# Patient Record
Sex: Female | Born: 1965 | Race: Black or African American | Hispanic: No | Marital: Single | Smoking: Never smoker
Health system: Southern US, Community
[De-identification: ages and names within clinical notes are randomized; demographics above are authoritative.]

## PROBLEM LIST (undated history)

## (undated) DIAGNOSIS — B9689 Other specified bacterial agents as the cause of diseases classified elsewhere: Secondary | ICD-10-CM

## (undated) DIAGNOSIS — N760 Acute vaginitis: Secondary | ICD-10-CM

## (undated) DIAGNOSIS — J45909 Unspecified asthma, uncomplicated: Secondary | ICD-10-CM

## (undated) HISTORY — PX: BREAST REDUCTION SURGERY: SHX8

## (undated) HISTORY — PX: ABDOMINAL HYSTERECTOMY: SHX81

## (undated) HISTORY — PX: TUBAL LIGATION: SHX77

---

## 2005-02-11 ENCOUNTER — Other Ambulatory Visit: Admission: RE | Admit: 2005-02-11 | Discharge: 2005-02-11 | Payer: Self-pay | Admitting: Family Medicine

## 2005-05-19 ENCOUNTER — Encounter: Admission: RE | Admit: 2005-05-19 | Discharge: 2005-05-19 | Payer: Self-pay | Admitting: Plastic Surgery

## 2005-06-04 ENCOUNTER — Encounter: Admission: RE | Admit: 2005-06-04 | Discharge: 2005-06-04 | Payer: Self-pay | Admitting: Plastic Surgery

## 2006-03-23 ENCOUNTER — Ambulatory Visit: Payer: Self-pay | Admitting: Internal Medicine

## 2006-06-01 ENCOUNTER — Ambulatory Visit: Payer: Self-pay | Admitting: Internal Medicine

## 2006-10-07 ENCOUNTER — Ambulatory Visit: Payer: Self-pay | Admitting: Internal Medicine

## 2006-12-20 ENCOUNTER — Encounter: Payer: Self-pay | Admitting: Internal Medicine

## 2006-12-20 DIAGNOSIS — R609 Edema, unspecified: Secondary | ICD-10-CM

## 2006-12-20 DIAGNOSIS — J309 Allergic rhinitis, unspecified: Secondary | ICD-10-CM | POA: Insufficient documentation

## 2006-12-20 DIAGNOSIS — E669 Obesity, unspecified: Secondary | ICD-10-CM | POA: Insufficient documentation

## 2006-12-20 DIAGNOSIS — J45909 Unspecified asthma, uncomplicated: Secondary | ICD-10-CM | POA: Insufficient documentation

## 2006-12-20 DIAGNOSIS — D509 Iron deficiency anemia, unspecified: Secondary | ICD-10-CM

## 2007-01-15 ENCOUNTER — Emergency Department (HOSPITAL_COMMUNITY): Admission: EM | Admit: 2007-01-15 | Discharge: 2007-01-15 | Payer: Self-pay | Admitting: *Deleted

## 2007-02-08 ENCOUNTER — Ambulatory Visit: Payer: Self-pay | Admitting: Internal Medicine

## 2007-02-08 DIAGNOSIS — E785 Hyperlipidemia, unspecified: Secondary | ICD-10-CM

## 2007-02-08 DIAGNOSIS — J019 Acute sinusitis, unspecified: Secondary | ICD-10-CM

## 2007-02-08 DIAGNOSIS — R21 Rash and other nonspecific skin eruption: Secondary | ICD-10-CM | POA: Insufficient documentation

## 2007-02-08 DIAGNOSIS — L708 Other acne: Secondary | ICD-10-CM

## 2007-02-08 DIAGNOSIS — R42 Dizziness and giddiness: Secondary | ICD-10-CM | POA: Insufficient documentation

## 2007-02-11 ENCOUNTER — Encounter: Payer: Self-pay | Admitting: Internal Medicine

## 2007-07-16 ENCOUNTER — Encounter: Payer: Self-pay | Admitting: Internal Medicine

## 2007-07-21 ENCOUNTER — Encounter: Payer: Self-pay | Admitting: Internal Medicine

## 2007-09-15 ENCOUNTER — Ambulatory Visit: Payer: Self-pay | Admitting: Internal Medicine

## 2007-09-15 DIAGNOSIS — F411 Generalized anxiety disorder: Secondary | ICD-10-CM | POA: Insufficient documentation

## 2007-09-15 DIAGNOSIS — F329 Major depressive disorder, single episode, unspecified: Secondary | ICD-10-CM

## 2007-09-23 ENCOUNTER — Ambulatory Visit (HOSPITAL_BASED_OUTPATIENT_CLINIC_OR_DEPARTMENT_OTHER): Admission: RE | Admit: 2007-09-23 | Discharge: 2007-09-23 | Payer: Self-pay | Admitting: General Surgery

## 2007-09-23 ENCOUNTER — Encounter (INDEPENDENT_AMBULATORY_CARE_PROVIDER_SITE_OTHER): Payer: Self-pay | Admitting: General Surgery

## 2008-03-22 ENCOUNTER — Ambulatory Visit: Payer: Self-pay | Admitting: Internal Medicine

## 2008-03-22 ENCOUNTER — Telehealth (INDEPENDENT_AMBULATORY_CARE_PROVIDER_SITE_OTHER): Payer: Self-pay | Admitting: *Deleted

## 2008-03-22 DIAGNOSIS — R030 Elevated blood-pressure reading, without diagnosis of hypertension: Secondary | ICD-10-CM | POA: Insufficient documentation

## 2008-03-23 ENCOUNTER — Telehealth (INDEPENDENT_AMBULATORY_CARE_PROVIDER_SITE_OTHER): Payer: Self-pay | Admitting: *Deleted

## 2008-05-25 ENCOUNTER — Telehealth (INDEPENDENT_AMBULATORY_CARE_PROVIDER_SITE_OTHER): Payer: Self-pay | Admitting: *Deleted

## 2008-07-08 ENCOUNTER — Telehealth: Payer: Self-pay | Admitting: Internal Medicine

## 2008-09-10 ENCOUNTER — Emergency Department: Payer: Self-pay | Admitting: Emergency Medicine

## 2009-06-09 IMAGING — CR DG CHEST 1V PORT
1 series · 1 of 1 positions shown · non-contrast
Comparison: none

CLINICAL DATA: Chest pressure.  Palpitations.
 PORTABLE CHEST - 1 VIEW - 01/15/07 AT 4974 HOURS:

[view not recorded]
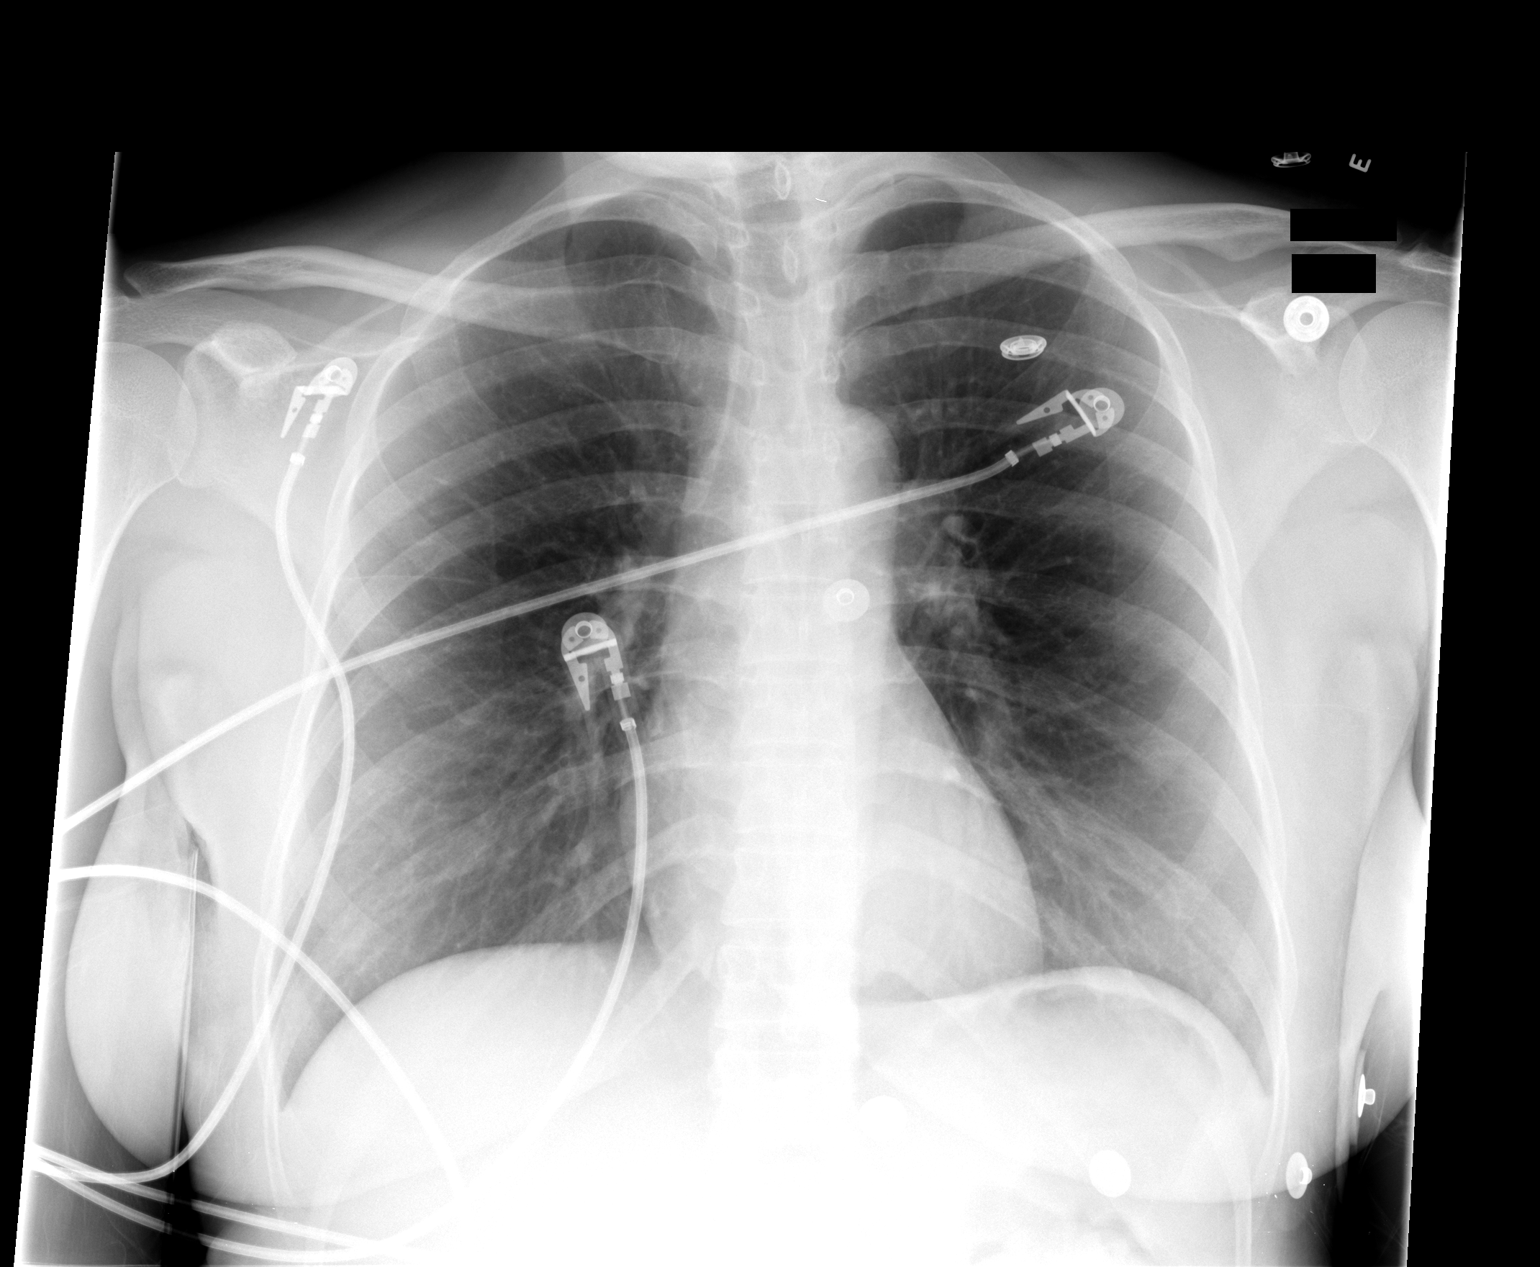

[1 of 1 positions shown; findings below may reference images not displayed]

FINDINGS: The heart size and mediastinal contours are within normal limits.  Both lungs are clear.
IMPRESSION: No acute disease.

## 2010-04-14 ENCOUNTER — Encounter: Payer: Self-pay | Admitting: Obstetrics and Gynecology

## 2010-04-14 ENCOUNTER — Encounter: Payer: Self-pay | Admitting: Plastic Surgery

## 2010-08-06 NOTE — Op Note (Signed)
[name], Raven Suarez                 ACCOUNT NO.:  [id number]   MEDICAL RECORD NO.:  [id number]          PATIENT TYPE:  AMB   LOCATION:  DSC                          FACILITY:  [facility]   PHYSICIAN:  Gabrielle Dare. Janee Morn, M.D.DATE OF BIRTH:  October 13, 1965   DATE OF PROCEDURE:  09/23/2007  DATE OF DISCHARGE:                               OPERATIVE REPORT   PREOPERATIVE DIAGNOSIS:  External hemorrhoids.   POSTOPERATIVE DIAGNOSES:  1. External hemorrhoids.  2. Internal hemorrhoid.   PROCEDURE:  1. External hemorrhoidectomy x2.  2. Banding of internal hemorrhoid x1.   SURGEON:  Gabrielle Dare. Janee Morn, MD   ANESTHESIA:  General.   HISTORY OF PRESENT ILLNESS:  I evaluated [name] in the office for  symptomatic external hemorrhoids.  She has had these for over 20 years.  They are becoming increasingly problematic with a lot of itching,  hygiene problems, and intermittent bleeding.  She presents today for  external hemorrhoidectomy.   PROCEDURE IN DETAIL:  Informed consent was obtained.  The patient  received intravenous antibiotics.  She was identified in the preop  holding area.  She was brought to operating room.  General anesthesia  was administered.  She was placed in lithotomy position.  Her perianal  region was prepped and draped in sterile fashion.  Next, the perianal  region was infiltrated with 0.5% Marcaine with epinephrine for  postoperative pain relief.  Digital rectal examination under anesthesia  revealed two significant external hemorrhoids with associated external  skin tags, one at 12 o'clock position and one at the 5 o'clock position.  There was also associated internal hemorrhoids associated primarily with  the 5 o'clock position external hemorrhoid.  Attention was first  directed to the 12 o'clock position.  Anal retractor was inserted with  lubricant.  A 3-0 chromic suture was placed and suture ligating the base  of the upper hemorrhoid.  A triangular incision was made  excising the  hemorrhoid and the external skin tag.  Hemorrhoid was then dissected off  with the vein dissected back up towards the suture ligature.  The vein  was suture ligated as well.  Specimen was removed.  The mucosal defect  was then closed with the running 3-0 chromic suture achieving excellent  hemostasis.  Attention was then directed to the 5 o'clock hemorrhoid.  Similarly, the base was suture ligated with 3-0 chromic.  A triangular  incision was made through the mucosa.  The hemorrhoidal vein was then  dissected out and suture ligated.  Internally, hemostasis was obtained  with the Bovie cautery.  The specimen was removed and sent to pathology.  The resulting mucosal defect was closed with running 3-0 chromic suture.  This did achieve excellent hemostasis.  There was an associated more  proximal internal hemorrhoid, however.  This seemed to be best suited  for banding.  It was well above the dentate line.  The band applicator  was used and this was applied securely encompassing the internal  hemorrhoid.  There was no bleeding from the banding.  The anal area was  irrigated.  Meticulous hemostasis was  assured at both sites.  Both  suture lines remained intact and dry.  An endorectal dressing with  Gelfoam and lubricant  was placed followed by a sterile external and gauze dressing.  The  patient tolerated the procedure well without apparent complication.  Sponge, needle, and instrument counts were all correct.  She was taken  recovery in stable condition.      Gabrielle Dare Janee Morn, M.D.  Electronically Signed     BET/MEDQ  D:  09/23/2007  T:  09/24/2007  Job:  [id number]   cc:   Naima A. Normand Sloop, M.D.  Corwin Levins, MD

## 2010-08-09 NOTE — Assessment & Plan Note (Signed)
[facility]                                 ON-CALL NOTE   LASHEENA, FRIEZE                          MRN:          [id number]  DATE:07/18/2006                            DOB:          07-Feb-1966    Patient is on albuterol for a nebulizer and a call into the office two  days ago or so, and the nurse said that they would call it in, and she  has no albuterol left and is asking for [place] to call it in on Saturday  evening.  Her asthma is not that bad, but she has been using the  nebulizer over the last three weeks because of the allergy season.   Called in a refill, [facility], [id number], for albuterol for nebulizer, one  box.  She will contact the office for further refills and will get  further help if she is having any respiratory decompensation.     Neta Mends. Panosh, MD  Electronically Signed    WKP/MedQ  DD: 07/18/2006  DT: 07/18/2006  Job #: [id number]

## 2010-09-17 ENCOUNTER — Ambulatory Visit: Payer: Self-pay | Admitting: Obstetrics and Gynecology

## 2010-09-23 ENCOUNTER — Ambulatory Visit: Payer: Self-pay | Admitting: Obstetrics and Gynecology

## 2010-09-24 LAB — PATHOLOGY REPORT

## 2010-09-27 ENCOUNTER — Emergency Department (HOSPITAL_COMMUNITY)
Admission: EM | Admit: 2010-09-27 | Discharge: 2010-09-28 | Disposition: A | Discharge status: Home / Self Care | Payer: BC Managed Care – PPO | Attending: Emergency Medicine | Admitting: Emergency Medicine

## 2010-09-27 DIAGNOSIS — K5900 Constipation, unspecified: Secondary | ICD-10-CM | POA: Insufficient documentation

## 2010-12-19 LAB — POCT HEMOGLOBIN-HEMACUE: Hemoglobin: 12.2

## 2010-12-19 LAB — BASIC METABOLIC PANEL
Calcium: 9.3
Creatinine, Ser: 0.69
GFR calc Af Amer: >60
GFR calc non Af Amer: >60
Sodium: 137

## 2011-01-01 LAB — CBC
Platelets: 258
RBC: 4.18
WBC: 5.3

## 2011-01-01 LAB — POCT CARDIAC MARKERS: Troponin i, poc: <0.05

## 2011-01-01 LAB — DIFFERENTIAL
Eosinophils Absolute: 0.1
Lymphocytes Relative: 35
Lymphs Abs: 1.9
Neutro Abs: 3
Neutrophils Relative %: 56

## 2011-01-01 LAB — I-STAT 8, (EC8 V) (CONVERTED LAB)
Acid-Base Excess: 3 — ABNORMAL HIGH
HCT: 40
Operator id: 288331
Potassium: 4.4
Sodium: 139
TCO2: 31
pH, Ven: 7.344 — ABNORMAL HIGH

## 2011-01-01 LAB — POCT I-STAT CREATININE
Creatinine, Ser: 0.8
Operator id: 288331

## 2013-10-02 ENCOUNTER — Emergency Department (HOSPITAL_COMMUNITY)
Admission: EM | Admit: 2013-10-02 | Discharge: 2013-10-02 | Disposition: A | Discharge status: Home / Self Care | Payer: Self-pay | Source: Home / Self Care | Attending: Family Medicine | Admitting: Family Medicine

## 2013-10-02 ENCOUNTER — Encounter (HOSPITAL_COMMUNITY): Payer: Self-pay | Admitting: Emergency Medicine

## 2013-10-02 ENCOUNTER — Other Ambulatory Visit (HOSPITAL_COMMUNITY)
Admission: RE | Admit: 2013-10-02 | Discharge: 2013-10-02 | Disposition: A | Discharge status: Home / Self Care | Payer: Self-pay | Source: Ambulatory Visit | Attending: Family Medicine | Admitting: Family Medicine

## 2013-10-02 DIAGNOSIS — Z113 Encounter for screening for infections with a predominantly sexual mode of transmission: Secondary | ICD-10-CM | POA: Insufficient documentation

## 2013-10-02 DIAGNOSIS — N760 Acute vaginitis: Secondary | ICD-10-CM | POA: Insufficient documentation

## 2013-10-02 HISTORY — DX: Other specified bacterial agents as the cause of diseases classified elsewhere: B96.89

## 2013-10-02 HISTORY — DX: Acute vaginitis: N76.0

## 2013-10-02 HISTORY — DX: Unspecified asthma, uncomplicated: J45.909

## 2013-10-02 LAB — POCT URINALYSIS DIP (DEVICE)
BILIRUBIN URINE: NEGATIVE
Glucose, UA: NEGATIVE mg/dL
HGB URINE DIPSTICK: NEGATIVE
Ketones, ur: NEGATIVE mg/dL
LEUKOCYTES UA: NEGATIVE
NITRITE: NEGATIVE
PH: 6 (ref 5.0–8.0)
Protein, ur: NEGATIVE mg/dL
Specific Gravity, Urine: 1.025 (ref 1.005–1.030)
UROBILINOGEN UA: 0.2 mg/dL (ref 0.0–1.0)

## 2013-10-02 MED ORDER — METRONIDAZOLE 500 MG PO TABS: 500.0000 mg | ORAL_TABLET | Freq: Two times a day (BID) | ORAL | Status: AC

## 2013-10-02 MED ORDER — METRONIDAZOLE 0.75 % VA GEL: 1.0000 | Freq: Every day | VAGINAL | Status: AC

## 2013-10-02 NOTE — ED Notes (Signed)
Reports vaginal odor x "months" with occasional slight discharge.  States this seems to be BV.  Pt is not sexually active.  Has tried boric acid, apple cider vinegar.

## 2013-10-02 NOTE — ED Provider Notes (Signed)
CSN: [id number]     Arrival date & time 10/02/13  1225 History   First MD Initiated Contact with Patient 10/02/13 1231     Chief Complaint  Patient presents with  . Vaginitis   (Consider location/radiation/quality/duration/timing/severity/associated sxs/prior Treatment) Patient is a 48 y.o. female presenting with vaginal discharge. The history is provided by the patient.  Vaginal Discharge Quality:  Mucopurulent and malodorous Severity:  Moderate Duration:  2 months Progression:  Unchanged Chronicity:  New Risk factors: no new sexual partner, no STI, no STI exposure and no unprotected sex     Past Medical History  Diagnosis Date  . Asthma   . Bacterial vaginosis    Past Surgical History  Procedure Laterality Date  . Abdominal hysterectomy    . Tubal ligation    . Breast reduction surgery    . Cesarean section     No family history on file. History  Substance Use Topics  . Smoking status: Never Smoker   . Smokeless tobacco: Not on file  . Alcohol Use: No   OB History   Grav Para Term Preterm Abortions TAB SAB Ect Mult Living                 Review of Systems  Constitutional: Negative.   Gastrointestinal: Negative.   Genitourinary: Positive for vaginal discharge. Negative for menstrual problem and pelvic pain.    Allergies  Review of patient's allergies indicates no known allergies.  Home Medications   Prior to Admission medications   Medication Sig Start Date End Date Taking? Authorizing Provider  UNKNOWN TO PATIENT Multiple vitamins   Yes Historical Provider, MD  metroNIDAZOLE (FLAGYL) 500 MG tablet Take 1 tablet (500 mg total) by mouth 2 (two) times daily. 10/02/13   Linna HoffJames D Yeshua Stryker, MD  metroNIDAZOLE (METROGEL VAGINAL) 0.75 % vaginal gel Place 1 Applicatorful vaginally at bedtime. For 5 nights 10/02/13   Linna HoffJames D Daman Steffenhagen, MD   BP 133/75  Pulse 74  Temp(Src) 98 F (36.7 C) (Oral)  Resp 16  SpO2 97% Physical Exam  Genitourinary: Uterus normal. There is no  rash on the right labia. There is no rash on the left labia. Cervix exhibits discharge. Cervix exhibits no motion tenderness and no friability. Right adnexum displays no tenderness. Left adnexum displays no tenderness. Vaginal discharge found.    ED Course  Procedures (including critical care time) Labs Review Labs Reviewed  POCT URINALYSIS DIP (DEVICE)  CERVICOVAGINAL ANCILLARY ONLY    Imaging Review No results found.   MDM   1. Acute vaginitis        Linna HoffJames D Olon Russ, MD 10/02/13 1310

## 2013-10-04 NOTE — ED Notes (Signed)
GC/Chlamydia neg., Affirm: Candida and Trich neg., Gardnerella pos.  Pt. adequately treated with Flagyl and Metrogel. Vassie MoselleYork, Marwa Fuhrman M 10/04/2013

## 2015-12-26 ENCOUNTER — Other Ambulatory Visit: Payer: Self-pay | Admitting: Family Medicine

## 2015-12-26 DIAGNOSIS — Z1231 Encounter for screening mammogram for malignant neoplasm of breast: Secondary | ICD-10-CM

## 2016-01-03 ENCOUNTER — Ambulatory Visit
Admission: RE | Admit: 2016-01-03 | Discharge: 2016-01-03 | Disposition: A | Discharge status: Home / Self Care | Payer: BC Managed Care – PPO | Source: Ambulatory Visit | Attending: Family Medicine | Admitting: Family Medicine

## 2016-01-03 DIAGNOSIS — Z1231 Encounter for screening mammogram for malignant neoplasm of breast: Secondary | ICD-10-CM
